# Patient Record
Sex: Female | Born: 1945 | Race: White | Hispanic: No | Smoking: Never smoker
Health system: Southern US, Community
[De-identification: ages and names within clinical notes are randomized; demographics above are authoritative.]

## PROBLEM LIST (undated history)

## (undated) HISTORY — PX: ABDOMINAL HYSTERECTOMY: SHX81

## (undated) HISTORY — PX: TONSILLECTOMY: SUR1361

## (undated) HISTORY — PX: SHOULDER SURGERY: SHX246

---

## 2014-05-26 ENCOUNTER — Other Ambulatory Visit: Payer: Self-pay | Admitting: Obstetrics and Gynecology

## 2014-05-26 DIAGNOSIS — N644 Mastodynia: Secondary | ICD-10-CM

## 2014-06-01 ENCOUNTER — Other Ambulatory Visit: Payer: Self-pay

## 2014-06-28 ENCOUNTER — Ambulatory Visit
Admission: RE | Admit: 2014-06-28 | Discharge: 2014-06-28 | Disposition: A | Discharge status: Home / Self Care | Payer: Medicare Other | Source: Ambulatory Visit | Attending: Obstetrics and Gynecology | Admitting: Obstetrics and Gynecology

## 2014-06-28 DIAGNOSIS — N644 Mastodynia: Secondary | ICD-10-CM

## 2014-08-14 ENCOUNTER — Ambulatory Visit: Payer: Self-pay | Admitting: Internal Medicine

## 2014-12-13 DIAGNOSIS — M2241 Chondromalacia patellae, right knee: Secondary | ICD-10-CM | POA: Diagnosis not present

## 2014-12-15 DIAGNOSIS — M2241 Chondromalacia patellae, right knee: Secondary | ICD-10-CM | POA: Diagnosis not present

## 2014-12-18 DIAGNOSIS — M2241 Chondromalacia patellae, right knee: Secondary | ICD-10-CM | POA: Diagnosis not present

## 2014-12-21 DIAGNOSIS — M2241 Chondromalacia patellae, right knee: Secondary | ICD-10-CM | POA: Diagnosis not present

## 2015-01-05 ENCOUNTER — Other Ambulatory Visit: Payer: Self-pay

## 2015-01-05 DIAGNOSIS — Z1231 Encounter for screening mammogram for malignant neoplasm of breast: Secondary | ICD-10-CM

## 2015-01-11 ENCOUNTER — Ambulatory Visit
Admission: RE | Admit: 2015-01-11 | Discharge: 2015-01-11 | Disposition: A | Discharge status: Home / Self Care | Payer: Medicare Other | Source: Ambulatory Visit

## 2015-01-11 DIAGNOSIS — Z1231 Encounter for screening mammogram for malignant neoplasm of breast: Secondary | ICD-10-CM | POA: Diagnosis not present

## 2015-01-16 DIAGNOSIS — Z1211 Encounter for screening for malignant neoplasm of colon: Secondary | ICD-10-CM | POA: Diagnosis not present

## 2015-01-16 DIAGNOSIS — Z0000 Encounter for general adult medical examination without abnormal findings: Secondary | ICD-10-CM | POA: Diagnosis not present

## 2015-01-16 DIAGNOSIS — B372 Candidiasis of skin and nail: Secondary | ICD-10-CM | POA: Diagnosis not present

## 2015-01-16 DIAGNOSIS — Z23 Encounter for immunization: Secondary | ICD-10-CM | POA: Diagnosis not present

## 2015-01-23 DIAGNOSIS — Z1211 Encounter for screening for malignant neoplasm of colon: Secondary | ICD-10-CM | POA: Diagnosis not present

## 2015-02-08 DIAGNOSIS — M2241 Chondromalacia patellae, right knee: Secondary | ICD-10-CM | POA: Diagnosis not present

## 2015-08-16 IMAGING — MG MM DIGITAL DIAGNOSTIC UNILAT*R*
2 series · 2 of 2 positions shown · non-contrast
Comparison: Prior mammograms from [facility], [[facility]] 12/16/2012,
12/08/2011, 12/04/2010

CLINICAL DATA: 68-year-old patient with recent right nipple pain.
Pain has decreased since she first noticed it. Patient has recently
moved from [HOSPITAL]. Her sister has a history of breast cancer,
diagnosed at approximately age 68.

EXAM:
DIGITAL DIAGNOSTIC  RIGHT MAMMOGRAM WITH CAD

[R MLO]
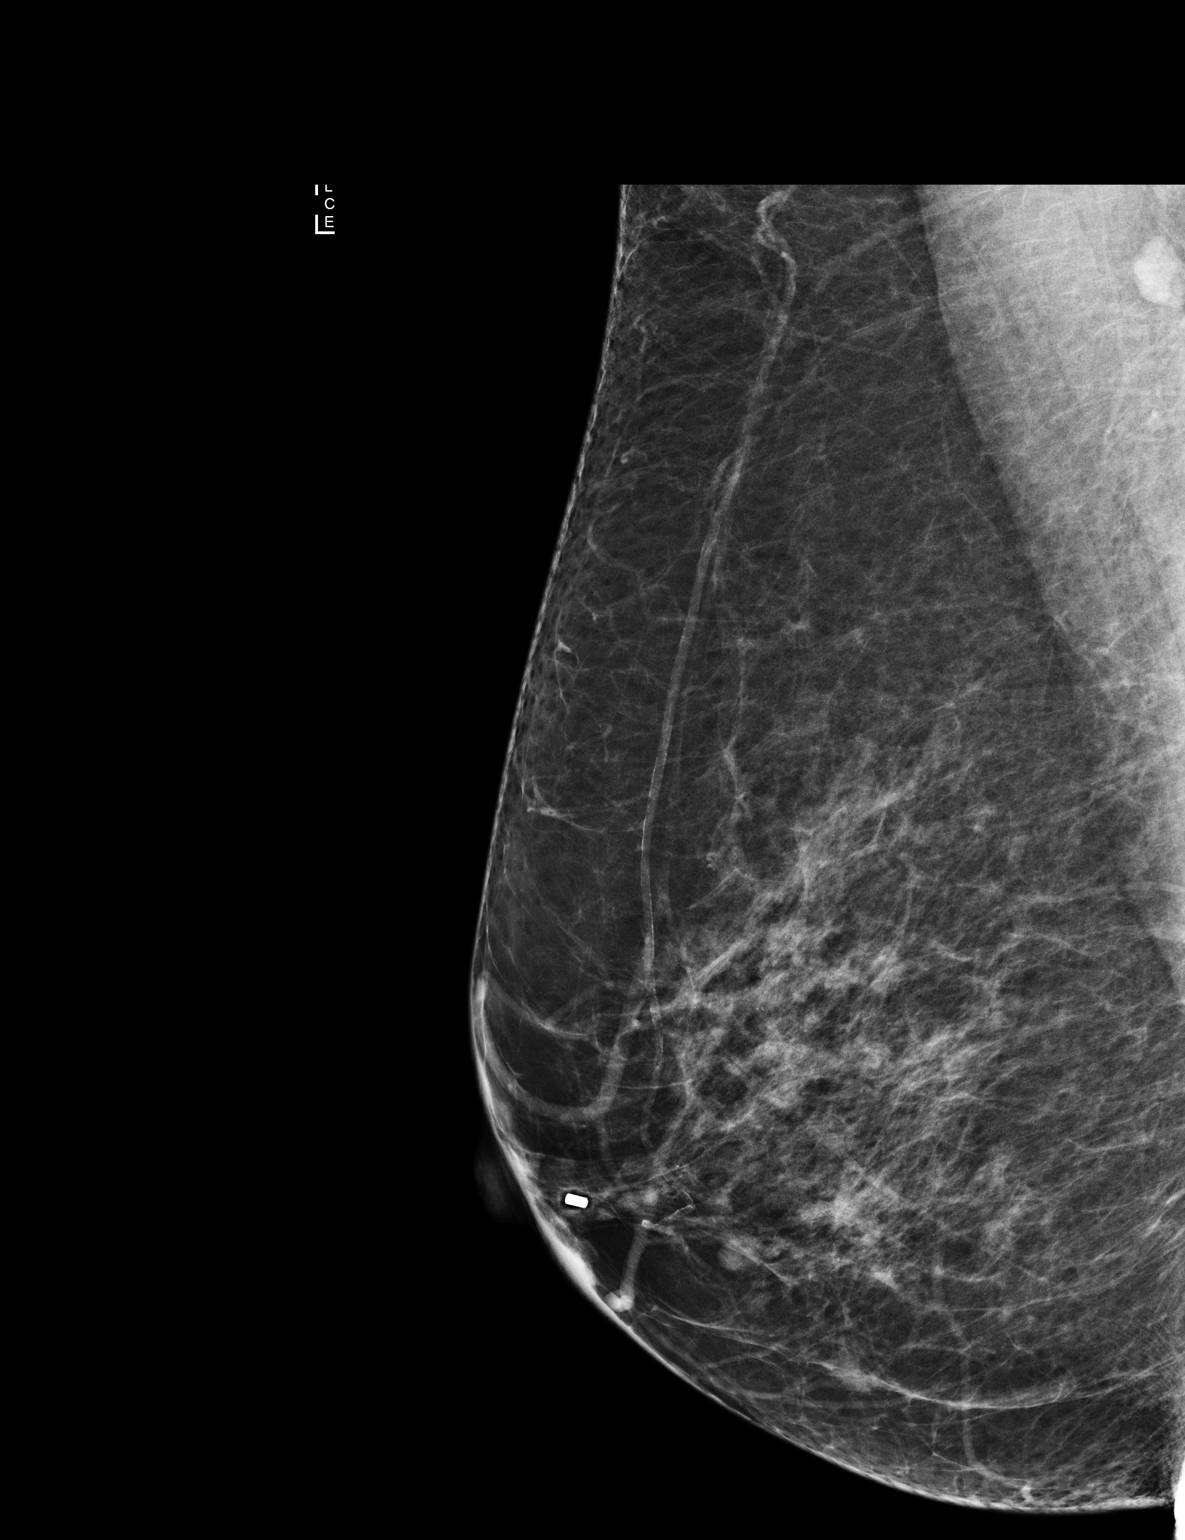

[R CC]
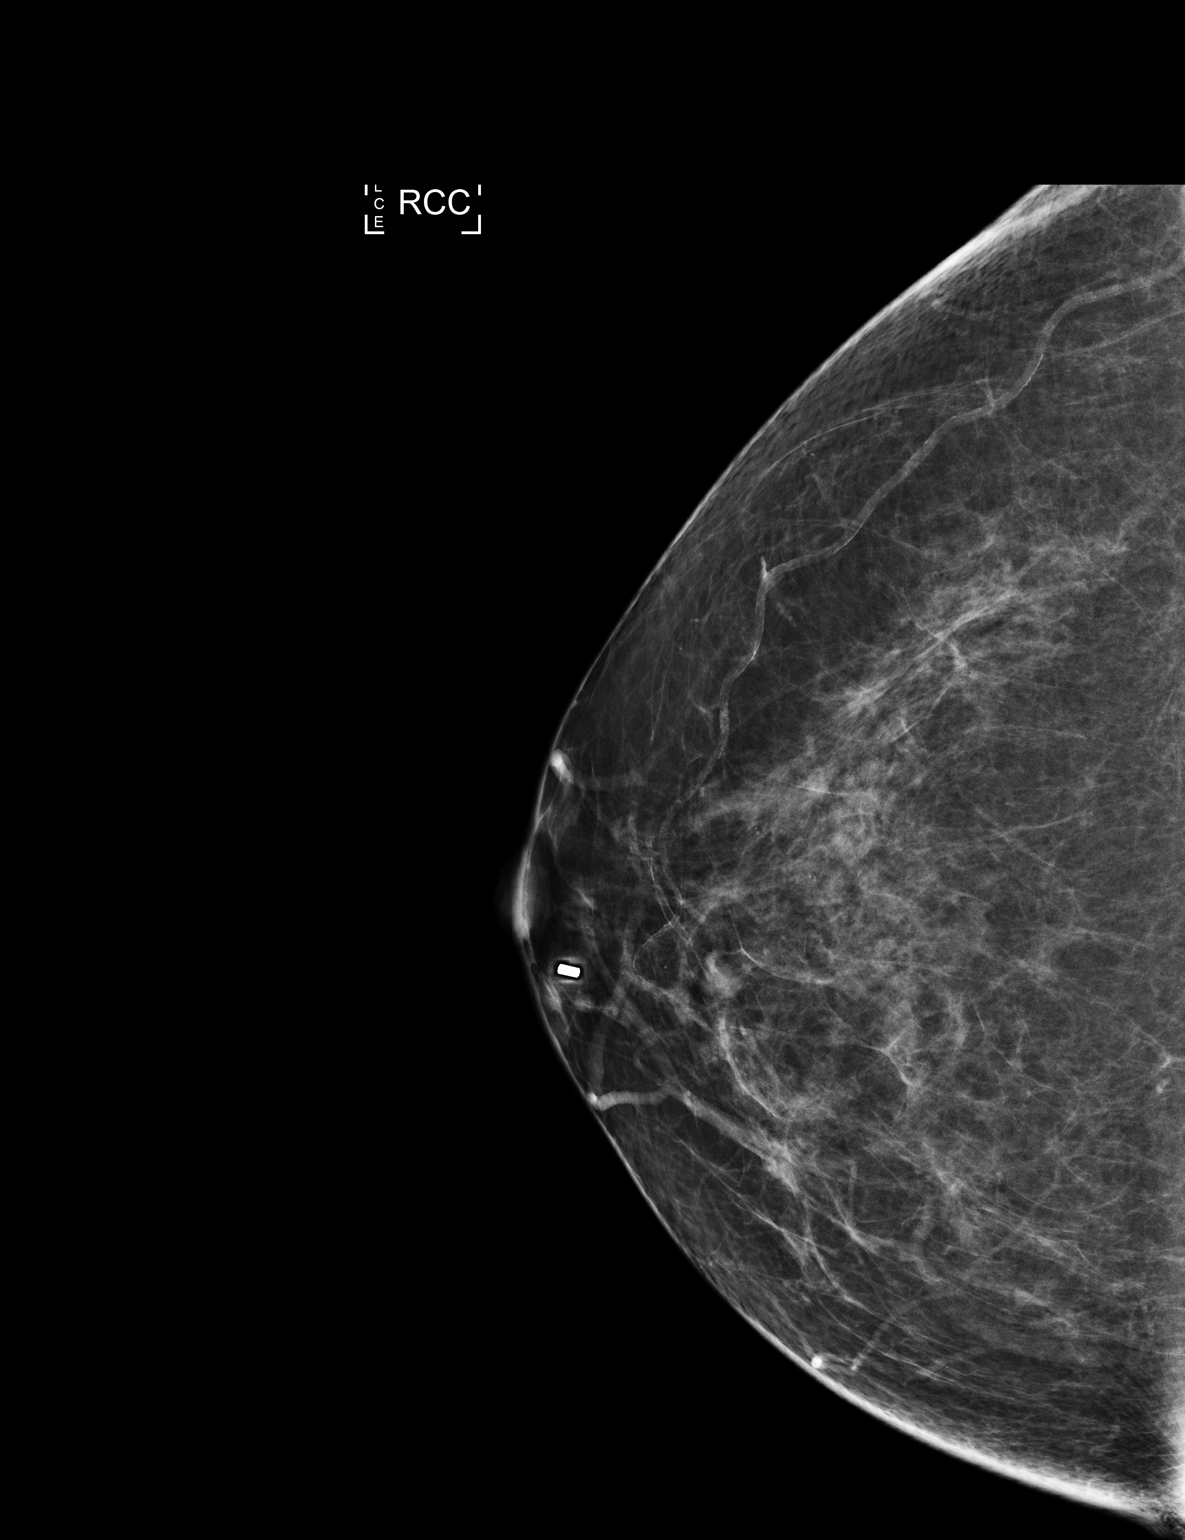

[2 of 2 positions shown; findings below may reference images not displayed]

ACR Breast Density Category b: There are scattered areas of
fibroglandular density.
FINDINGS: Cylindrical biopsy clip is noted in the retroareolar right breast.
No suspicious mass, distortion, or suspicious microcalcification is
identified in the right breast to suggest malignancy.

Mammographic images were processed with CAD.
IMPRESSION: No evidence of malignancy in the right breast.

RECOMMENDATION:
Bilateral screening mammogram in January 2015 is recommended.

I have discussed the findings and recommendations with the patient.
Results were also provided in writing at the conclusion of the
visit. If applicable, a reminder letter will be sent to the patient
regarding the next appointment.

BI-RADS CATEGORY  1: Negative

## 2015-08-23 DIAGNOSIS — Z6827 Body mass index (BMI) 27.0-27.9, adult: Secondary | ICD-10-CM | POA: Diagnosis not present

## 2015-08-23 DIAGNOSIS — Z90710 Acquired absence of both cervix and uterus: Secondary | ICD-10-CM | POA: Diagnosis not present

## 2015-08-23 DIAGNOSIS — Z124 Encounter for screening for malignant neoplasm of cervix: Secondary | ICD-10-CM | POA: Diagnosis not present

## 2015-08-23 DIAGNOSIS — Z1272 Encounter for screening for malignant neoplasm of vagina: Secondary | ICD-10-CM | POA: Diagnosis not present

## 2016-01-08 DIAGNOSIS — D2230 Melanocytic nevi of unspecified part of face: Secondary | ICD-10-CM | POA: Diagnosis not present

## 2016-01-08 DIAGNOSIS — Z23 Encounter for immunization: Secondary | ICD-10-CM | POA: Diagnosis not present

## 2016-01-08 DIAGNOSIS — L570 Actinic keratosis: Secondary | ICD-10-CM | POA: Diagnosis not present

## 2016-01-08 DIAGNOSIS — D224 Melanocytic nevi of scalp and neck: Secondary | ICD-10-CM | POA: Diagnosis not present

## 2016-01-08 DIAGNOSIS — L821 Other seborrheic keratosis: Secondary | ICD-10-CM | POA: Diagnosis not present

## 2016-05-15 DIAGNOSIS — L821 Other seborrheic keratosis: Secondary | ICD-10-CM | POA: Diagnosis not present

## 2016-05-15 DIAGNOSIS — D224 Melanocytic nevi of scalp and neck: Secondary | ICD-10-CM | POA: Diagnosis not present

## 2016-05-15 DIAGNOSIS — K130 Diseases of lips: Secondary | ICD-10-CM | POA: Diagnosis not present

## 2016-05-15 DIAGNOSIS — D235 Other benign neoplasm of skin of trunk: Secondary | ICD-10-CM | POA: Diagnosis not present

## 2016-05-15 DIAGNOSIS — D1800 Hemangioma unspecified site: Secondary | ICD-10-CM | POA: Diagnosis not present

## 2016-05-15 DIAGNOSIS — D2230 Melanocytic nevi of unspecified part of face: Secondary | ICD-10-CM | POA: Diagnosis not present

## 2016-07-31 DIAGNOSIS — K219 Gastro-esophageal reflux disease without esophagitis: Secondary | ICD-10-CM | POA: Diagnosis not present

## 2016-07-31 DIAGNOSIS — Z23 Encounter for immunization: Secondary | ICD-10-CM | POA: Diagnosis not present

## 2016-08-22 DIAGNOSIS — H524 Presbyopia: Secondary | ICD-10-CM | POA: Diagnosis not present

## 2016-08-22 DIAGNOSIS — H2513 Age-related nuclear cataract, bilateral: Secondary | ICD-10-CM | POA: Diagnosis not present

## 2016-08-22 DIAGNOSIS — H02834 Dermatochalasis of left upper eyelid: Secondary | ICD-10-CM | POA: Diagnosis not present

## 2016-08-22 DIAGNOSIS — H02831 Dermatochalasis of right upper eyelid: Secondary | ICD-10-CM | POA: Diagnosis not present

## 2016-09-14 DIAGNOSIS — J209 Acute bronchitis, unspecified: Secondary | ICD-10-CM | POA: Diagnosis not present

## 2016-09-14 DIAGNOSIS — J0110 Acute frontal sinusitis, unspecified: Secondary | ICD-10-CM | POA: Diagnosis not present

## 2016-09-26 ENCOUNTER — Other Ambulatory Visit: Payer: Self-pay | Admitting: Family Medicine

## 2016-09-26 ENCOUNTER — Ambulatory Visit
Admission: RE | Admit: 2016-09-26 | Discharge: 2016-09-26 | Disposition: A | Discharge status: Home / Self Care | Payer: Medicare Other | Source: Ambulatory Visit | Attending: Family Medicine | Admitting: Family Medicine

## 2016-09-26 DIAGNOSIS — R05 Cough: Secondary | ICD-10-CM

## 2016-09-26 DIAGNOSIS — R059 Cough, unspecified: Secondary | ICD-10-CM

## 2016-09-26 DIAGNOSIS — K219 Gastro-esophageal reflux disease without esophagitis: Secondary | ICD-10-CM | POA: Diagnosis not present

## 2016-09-26 DIAGNOSIS — J209 Acute bronchitis, unspecified: Secondary | ICD-10-CM | POA: Diagnosis not present

## 2016-12-16 DIAGNOSIS — Z1231 Encounter for screening mammogram for malignant neoplasm of breast: Secondary | ICD-10-CM | POA: Diagnosis not present

## 2016-12-24 DIAGNOSIS — J0100 Acute maxillary sinusitis, unspecified: Secondary | ICD-10-CM | POA: Diagnosis not present

## 2016-12-24 DIAGNOSIS — J301 Allergic rhinitis due to pollen: Secondary | ICD-10-CM | POA: Diagnosis not present

## 2016-12-31 DIAGNOSIS — K219 Gastro-esophageal reflux disease without esophagitis: Secondary | ICD-10-CM | POA: Diagnosis not present

## 2016-12-31 DIAGNOSIS — J209 Acute bronchitis, unspecified: Secondary | ICD-10-CM | POA: Diagnosis not present

## 2017-01-17 DIAGNOSIS — R319 Hematuria, unspecified: Secondary | ICD-10-CM | POA: Diagnosis not present

## 2017-01-17 DIAGNOSIS — N390 Urinary tract infection, site not specified: Secondary | ICD-10-CM | POA: Diagnosis not present

## 2017-01-17 DIAGNOSIS — R350 Frequency of micturition: Secondary | ICD-10-CM | POA: Diagnosis not present

## 2017-01-17 DIAGNOSIS — R3915 Urgency of urination: Secondary | ICD-10-CM | POA: Diagnosis not present

## 2017-01-17 DIAGNOSIS — R300 Dysuria: Secondary | ICD-10-CM | POA: Diagnosis not present

## 2017-02-02 DIAGNOSIS — R311 Benign essential microscopic hematuria: Secondary | ICD-10-CM | POA: Diagnosis not present

## 2017-02-02 DIAGNOSIS — N390 Urinary tract infection, site not specified: Secondary | ICD-10-CM | POA: Diagnosis not present

## 2017-02-02 DIAGNOSIS — K219 Gastro-esophageal reflux disease without esophagitis: Secondary | ICD-10-CM | POA: Diagnosis not present

## 2017-02-02 DIAGNOSIS — R05 Cough: Secondary | ICD-10-CM | POA: Diagnosis not present

## 2017-04-07 DIAGNOSIS — K219 Gastro-esophageal reflux disease without esophagitis: Secondary | ICD-10-CM | POA: Diagnosis not present

## 2017-04-07 DIAGNOSIS — R319 Hematuria, unspecified: Secondary | ICD-10-CM | POA: Diagnosis not present

## 2017-04-07 DIAGNOSIS — N951 Menopausal and female climacteric states: Secondary | ICD-10-CM | POA: Diagnosis not present

## 2017-04-07 DIAGNOSIS — Z9109 Other allergy status, other than to drugs and biological substances: Secondary | ICD-10-CM | POA: Diagnosis not present

## 2017-04-09 DIAGNOSIS — L821 Other seborrheic keratosis: Secondary | ICD-10-CM | POA: Diagnosis not present

## 2017-04-09 DIAGNOSIS — D224 Melanocytic nevi of scalp and neck: Secondary | ICD-10-CM | POA: Diagnosis not present

## 2017-04-09 DIAGNOSIS — D225 Melanocytic nevi of trunk: Secondary | ICD-10-CM | POA: Diagnosis not present

## 2017-04-09 DIAGNOSIS — D2230 Melanocytic nevi of unspecified part of face: Secondary | ICD-10-CM | POA: Diagnosis not present

## 2017-04-09 DIAGNOSIS — L570 Actinic keratosis: Secondary | ICD-10-CM | POA: Diagnosis not present

## 2017-05-06 DIAGNOSIS — K219 Gastro-esophageal reflux disease without esophagitis: Secondary | ICD-10-CM | POA: Diagnosis not present

## 2017-05-22 DIAGNOSIS — K219 Gastro-esophageal reflux disease without esophagitis: Secondary | ICD-10-CM | POA: Diagnosis not present

## 2017-05-22 DIAGNOSIS — R1310 Dysphagia, unspecified: Secondary | ICD-10-CM | POA: Diagnosis not present

## 2017-08-10 DIAGNOSIS — K219 Gastro-esophageal reflux disease without esophagitis: Secondary | ICD-10-CM | POA: Diagnosis not present

## 2017-11-02 ENCOUNTER — Encounter (HOSPITAL_BASED_OUTPATIENT_CLINIC_OR_DEPARTMENT_OTHER): Payer: Self-pay | Admitting: Emergency Medicine

## 2017-11-02 ENCOUNTER — Emergency Department (HOSPITAL_BASED_OUTPATIENT_CLINIC_OR_DEPARTMENT_OTHER)
Admission: EM | Admit: 2017-11-02 | Discharge: 2017-11-02 | Disposition: A | Discharge status: Home / Self Care | Payer: Medicare Other | Attending: Emergency Medicine | Admitting: Emergency Medicine

## 2017-11-02 ENCOUNTER — Other Ambulatory Visit: Payer: Self-pay

## 2017-11-02 DIAGNOSIS — J209 Acute bronchitis, unspecified: Secondary | ICD-10-CM | POA: Insufficient documentation

## 2017-11-02 DIAGNOSIS — R05 Cough: Secondary | ICD-10-CM | POA: Diagnosis present

## 2017-11-02 DIAGNOSIS — Z91040 Latex allergy status: Secondary | ICD-10-CM | POA: Insufficient documentation

## 2017-11-02 MED ORDER — DEXAMETHASONE SODIUM PHOSPHATE 10 MG/ML IJ SOLN
10.0000 mg | Freq: Once | INTRAMUSCULAR | Status: DC
  Filled 2017-11-02: qty 1

## 2017-11-02 MED ORDER — ALBUTEROL SULFATE HFA 108 (90 BASE) MCG/ACT IN AERS
INHALATION_SPRAY | RESPIRATORY_TRACT | Status: AC
  Filled 2017-11-02: qty 6.7

## 2017-11-02 MED ORDER — HYDROCOD POLST-CPM POLST ER 10-8 MG/5ML PO SUER: 5.0000 mL | Freq: Two times a day (BID) | ORAL | 0 refills | Status: AC | PRN

## 2017-11-02 MED ORDER — ALBUTEROL SULFATE HFA 108 (90 BASE) MCG/ACT IN AERS
2.0000 | INHALATION_SPRAY | RESPIRATORY_TRACT | Status: DC | PRN
Start: 2017-11-02 — End: 2017-11-02
  Administered 2017-11-02: 2 via RESPIRATORY_TRACT

## 2017-11-02 NOTE — ED Triage Notes (Signed)
Patient states that she has had "spasms" of coughing tonight. The patient states that she now is having trouble breathing

## 2017-11-02 NOTE — ED Notes (Signed)
ED Provider at bedside. 

## 2017-11-02 NOTE — ED Provider Notes (Signed)
[facility] Provider Note: Georgena Spurling, MD, FACEP  CSN: [id number] MRN: [id number] ARRIVAL: 11/02/17 at [place]: [place]  Cough   HISTORY OF PRESENT ILLNESS  11/02/17 1:38 AM Alexis Hanna is a 71 y.o. female with a history of episodic bronchitis in the past.  She has never been a smoker.  She is here with a 2-day history of a cough.  The cough is paroxysmal at times and is occasionally been severe.  It is associated with a burning sensation in the center of her chest when she coughs.  She denies frank shortness of breath at the present time but her paroxysms have been severe enough to make her feel like she was having difficulty breathing.  She denies fever.  The cough is been productive of scant white phlegm.  She has been taking over-the-counter guaifenesin and dextromethorphan without adequate relief.   History reviewed. No pertinent past medical history.  Past Surgical History:  Procedure Laterality Date  . ABDOMINAL HYSTERECTOMY    . SHOULDER SURGERY    . TONSILLECTOMY      History reviewed. No pertinent family history.  Social History   Tobacco Use  . Smoking status: Never Smoker  . Smokeless tobacco: Never Used  Substance Use Topics  . Alcohol use: No    Frequency: Never  . Drug use: No    Prior to Admission medications   Medication Sig Start Date End Date Taking? Authorizing Provider  glucosamine-chondroitin 500-400 MG tablet Take 1 tablet by mouth 3 (three) times daily.   Yes [provider]    Allergies Latex   REVIEW OF SYSTEMS  Negative except as noted here or in the History of Present Illness.   PHYSICAL EXAMINATION  Initial Vital Signs Blood pressure 135/66, pulse 73, temperature 97.8 F (36.6 C), resp. rate 18, height 5\' 4"  (1.626 m), weight 74.8 kg (165 lb), SpO2 93 %.  Examination General: Well-developed, well-nourished female in no acute distress; appearance consistent with age of  record HENT: normocephalic; atraumatic Eyes: pupils equal, round and reactive to light; extraocular muscles intact Neck: supple Heart: regular rate and rhythm Lungs: Mild coarse sounds bilaterally; rattly cough Abdomen: soft; nondistended; nontender; bowel sounds present Extremities: No deformity; full range of motion Neurologic: Awake, alert and oriented; motor function intact in all extremities and symmetric; no facial droop Skin: Warm and dry Psychiatric: Normal mood and affect   RESULTS  Summary of this visit's results, reviewed by myself:   EKG Interpretation  Date/Time:    Ventricular Rate:    PR Interval:    QRS Duration:   QT Interval:    QTC Calculation:   R Axis:     Text Interpretation:        Laboratory Studies: No results found for this or any previous visit (from the past 24 hour(s)). Imaging Studies: No results found.  ED COURSE  Nursing notes and initial vitals signs, including pulse oximetry, reviewed.  Vitals:   11/02/17 0012 11/02/17 0018  BP:  135/66  Pulse:  73  Resp:  18  Temp:  97.8 F (36.6 C)  SpO2:  93%  Weight: 74.8 kg (165 lb)   Height: 5\' 4"  (1.626 m)    Patient given inhaler and instructed in its use.  I do not believe antibiotics are indicated at this time.  PROCEDURES    ED DIAGNOSES     ICD-10-CM   1. Acute bronchitis with bronchospasm J20.9  Shanon Rosser, MD 11/02/17 [phone]

## 2017-11-14 IMAGING — CR DG CHEST 2V
2 series · 2 of 2 positions shown · non-contrast
Comparison: None.

CLINICAL DATA: Chronic cough for years.

EXAM:
CHEST  2 VIEW

[w chest pa]
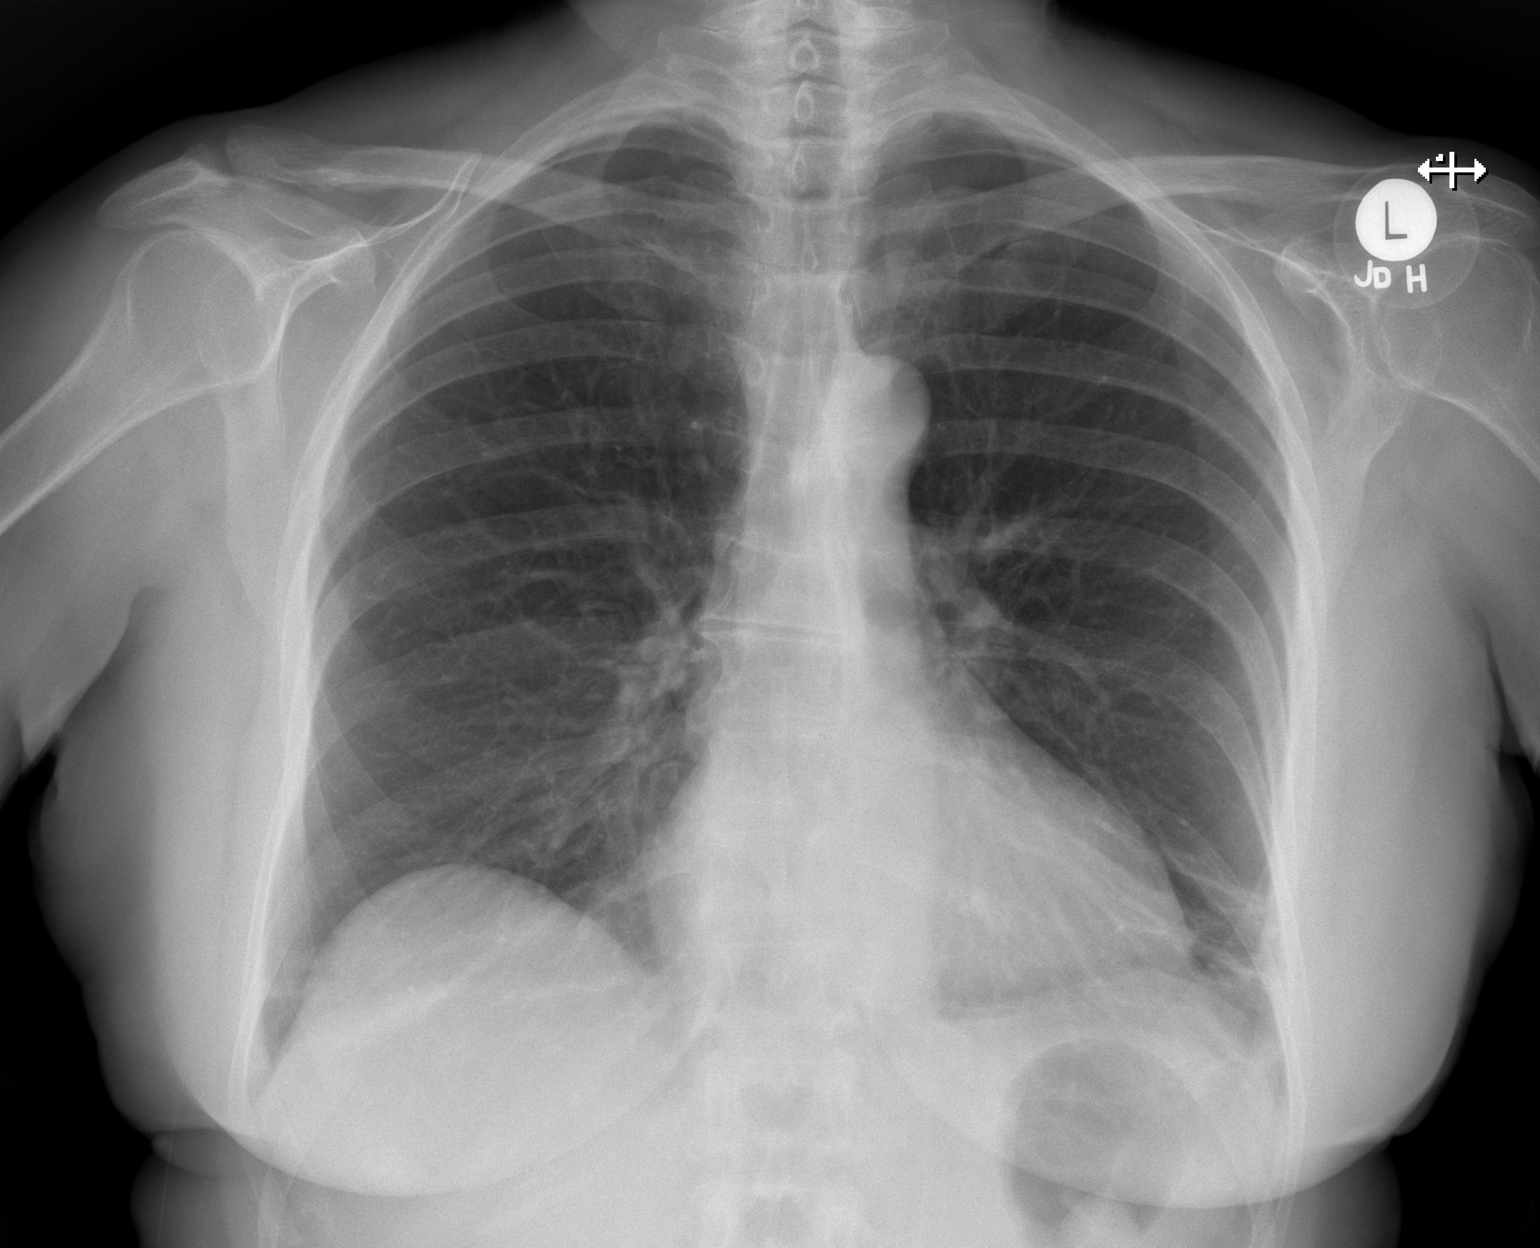

[w chest lat]
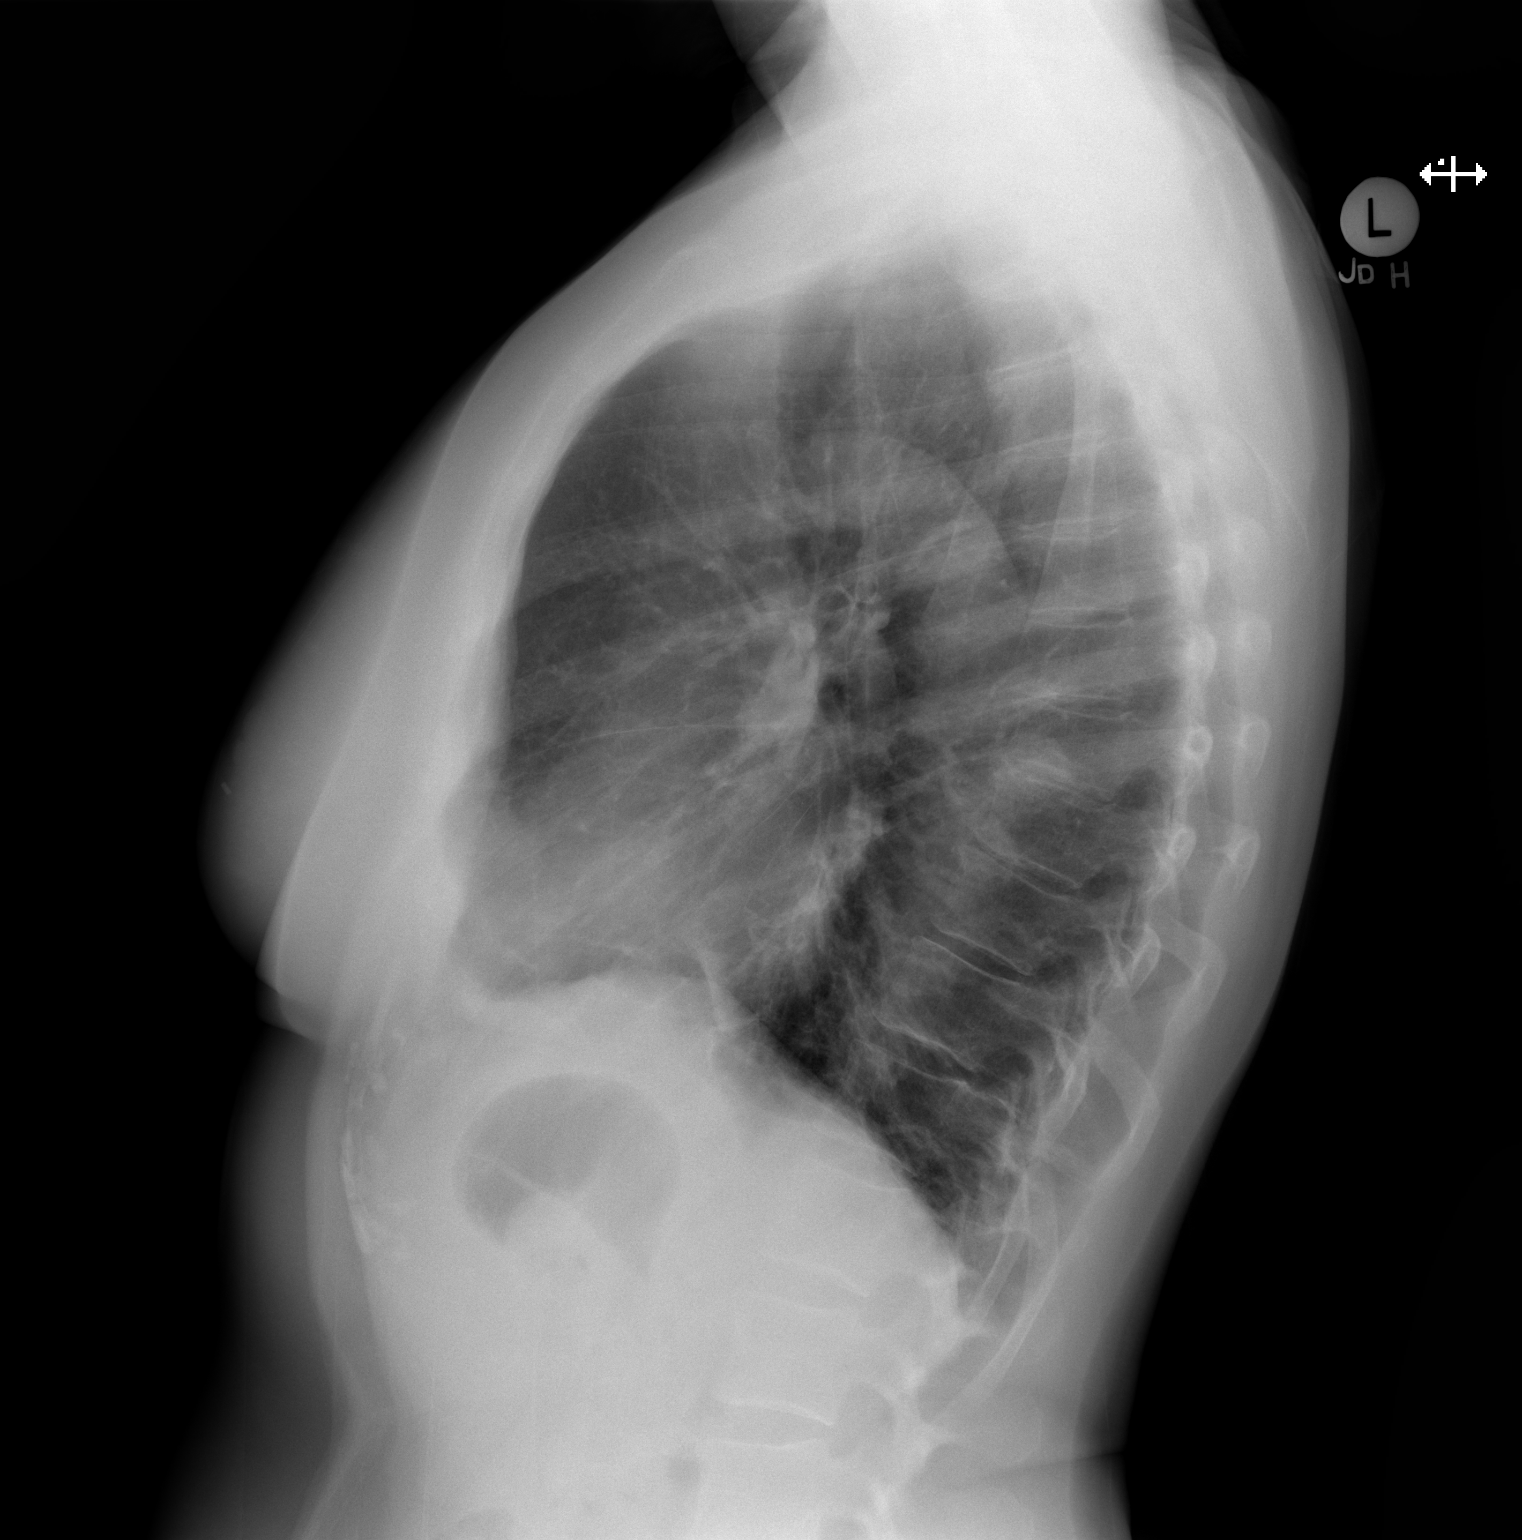

[2 of 2 positions shown; findings below may reference images not displayed]

FINDINGS: Evidence for at least mild hyperinflation. Linear densities in the
left lower chest could represent scarring or atelectasis. Otherwise,
the lungs are clear. Heart and mediastinum are within normal limits.
No pleural effusions. No acute bone abnormalities.
IMPRESSION: Left basilar densities are most compatible with atelectasis or
scarring.

## 2017-11-17 DIAGNOSIS — J0110 Acute frontal sinusitis, unspecified: Secondary | ICD-10-CM | POA: Diagnosis not present

## 2017-11-17 DIAGNOSIS — K219 Gastro-esophageal reflux disease without esophagitis: Secondary | ICD-10-CM | POA: Diagnosis not present

## 2018-03-16 DIAGNOSIS — Z1231 Encounter for screening mammogram for malignant neoplasm of breast: Secondary | ICD-10-CM | POA: Diagnosis not present

## 2018-04-21 DIAGNOSIS — L820 Inflamed seborrheic keratosis: Secondary | ICD-10-CM | POA: Diagnosis not present

## 2018-04-21 DIAGNOSIS — D170 Benign lipomatous neoplasm of skin and subcutaneous tissue of head, face and neck: Secondary | ICD-10-CM | POA: Diagnosis not present

## 2018-05-28 DIAGNOSIS — Z1211 Encounter for screening for malignant neoplasm of colon: Secondary | ICD-10-CM | POA: Diagnosis not present

## 2018-05-28 DIAGNOSIS — R151 Fecal smearing: Secondary | ICD-10-CM | POA: Diagnosis not present

## 2018-05-28 DIAGNOSIS — K210 Gastro-esophageal reflux disease with esophagitis: Secondary | ICD-10-CM | POA: Diagnosis not present

## 2018-05-28 DIAGNOSIS — K6289 Other specified diseases of anus and rectum: Secondary | ICD-10-CM | POA: Diagnosis not present

## 2018-06-01 DIAGNOSIS — Z1211 Encounter for screening for malignant neoplasm of colon: Secondary | ICD-10-CM | POA: Diagnosis not present

## 2018-06-01 DIAGNOSIS — K5730 Diverticulosis of large intestine without perforation or abscess without bleeding: Secondary | ICD-10-CM | POA: Diagnosis not present

## 2018-06-01 DIAGNOSIS — D125 Benign neoplasm of sigmoid colon: Secondary | ICD-10-CM | POA: Diagnosis not present

## 2018-06-21 DIAGNOSIS — K219 Gastro-esophageal reflux disease without esophagitis: Secondary | ICD-10-CM | POA: Diagnosis not present

## 2018-06-21 DIAGNOSIS — K5790 Diverticulosis of intestine, part unspecified, without perforation or abscess without bleeding: Secondary | ICD-10-CM | POA: Diagnosis not present

## 2018-07-23 DIAGNOSIS — Z13220 Encounter for screening for lipoid disorders: Secondary | ICD-10-CM | POA: Diagnosis not present

## 2018-07-23 DIAGNOSIS — Z1159 Encounter for screening for other viral diseases: Secondary | ICD-10-CM | POA: Diagnosis not present

## 2018-07-23 DIAGNOSIS — Z136 Encounter for screening for cardiovascular disorders: Secondary | ICD-10-CM | POA: Diagnosis not present

## 2018-07-23 DIAGNOSIS — Z1329 Encounter for screening for other suspected endocrine disorder: Secondary | ICD-10-CM | POA: Diagnosis not present

## 2018-07-23 DIAGNOSIS — Z780 Asymptomatic menopausal state: Secondary | ICD-10-CM | POA: Diagnosis not present

## 2018-07-23 DIAGNOSIS — Z0000 Encounter for general adult medical examination without abnormal findings: Secondary | ICD-10-CM | POA: Diagnosis not present

## 2018-07-23 DIAGNOSIS — Z23 Encounter for immunization: Secondary | ICD-10-CM | POA: Diagnosis not present

## 2018-07-27 DIAGNOSIS — H524 Presbyopia: Secondary | ICD-10-CM | POA: Diagnosis not present

## 2018-07-27 DIAGNOSIS — H2513 Age-related nuclear cataract, bilateral: Secondary | ICD-10-CM | POA: Diagnosis not present

## 2018-09-16 DIAGNOSIS — Z23 Encounter for immunization: Secondary | ICD-10-CM | POA: Diagnosis not present

## 2018-11-17 DIAGNOSIS — L570 Actinic keratosis: Secondary | ICD-10-CM | POA: Diagnosis not present

## 2018-11-17 DIAGNOSIS — L821 Other seborrheic keratosis: Secondary | ICD-10-CM | POA: Diagnosis not present

## 2018-11-17 DIAGNOSIS — D2230 Melanocytic nevi of unspecified part of face: Secondary | ICD-10-CM | POA: Diagnosis not present

## 2018-11-17 DIAGNOSIS — D224 Melanocytic nevi of scalp and neck: Secondary | ICD-10-CM | POA: Diagnosis not present

## 2018-11-17 DIAGNOSIS — Z23 Encounter for immunization: Secondary | ICD-10-CM | POA: Diagnosis not present
